# Patient Record
Sex: Female | Born: 2000 | Race: White | Hispanic: No | Marital: Single | Smoking: Current some day smoker
Health system: Southern US, Community
[De-identification: ages and names within clinical notes are randomized; demographics above are authoritative.]

---
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
## 2018-01-24 ENCOUNTER — Emergency Department (HOSPITAL_COMMUNITY)
Admission: EM | Admit: 2018-01-24 | Discharge: 2018-01-24 | Disposition: A | Discharge status: Home / Self Care | Payer: Medicaid Other | Attending: Emergency Medicine | Admitting: Emergency Medicine

## 2018-01-24 ENCOUNTER — Other Ambulatory Visit: Payer: Self-pay

## 2018-01-24 ENCOUNTER — Emergency Department (HOSPITAL_COMMUNITY): Payer: Medicaid Other

## 2018-01-24 ENCOUNTER — Encounter (HOSPITAL_COMMUNITY): Payer: Self-pay | Admitting: Emergency Medicine

## 2018-01-24 DIAGNOSIS — F17210 Nicotine dependence, cigarettes, uncomplicated: Secondary | ICD-10-CM | POA: Diagnosis not present

## 2018-01-24 DIAGNOSIS — S40211A Abrasion of right shoulder, initial encounter: Secondary | ICD-10-CM | POA: Diagnosis not present

## 2018-01-24 DIAGNOSIS — S80812A Abrasion, left lower leg, initial encounter: Secondary | ICD-10-CM | POA: Insufficient documentation

## 2018-01-24 DIAGNOSIS — S0990XA Unspecified injury of head, initial encounter: Secondary | ICD-10-CM | POA: Diagnosis present

## 2018-01-24 DIAGNOSIS — T07XXXA Unspecified multiple injuries, initial encounter: Secondary | ICD-10-CM

## 2018-01-24 DIAGNOSIS — Y999 Unspecified external cause status: Secondary | ICD-10-CM | POA: Insufficient documentation

## 2018-01-24 DIAGNOSIS — M791 Myalgia, unspecified site: Secondary | ICD-10-CM

## 2018-01-24 DIAGNOSIS — R109 Unspecified abdominal pain: Secondary | ICD-10-CM | POA: Insufficient documentation

## 2018-01-24 DIAGNOSIS — Y9389 Activity, other specified: Secondary | ICD-10-CM | POA: Diagnosis not present

## 2018-01-24 DIAGNOSIS — Y92410 Unspecified street and highway as the place of occurrence of the external cause: Secondary | ICD-10-CM | POA: Insufficient documentation

## 2018-01-24 DIAGNOSIS — R51 Headache: Secondary | ICD-10-CM | POA: Diagnosis not present

## 2018-01-24 DIAGNOSIS — M542 Cervicalgia: Secondary | ICD-10-CM | POA: Insufficient documentation

## 2018-01-24 DIAGNOSIS — M25561 Pain in right knee: Secondary | ICD-10-CM

## 2018-01-24 DIAGNOSIS — M25511 Pain in right shoulder: Secondary | ICD-10-CM

## 2018-01-24 DIAGNOSIS — S80811A Abrasion, right lower leg, initial encounter: Secondary | ICD-10-CM | POA: Diagnosis not present

## 2018-01-24 LAB — COMPREHENSIVE METABOLIC PANEL
ALBUMIN: 4.4 g/dL (ref 3.5–5.0)
ALK PHOS: 71 U/L (ref 38–126)
ALT: 11 U/L — AB (ref 14–54)
ANION GAP: 11 (ref 5–15)
AST: 24 U/L (ref 15–41)
BILIRUBIN TOTAL: 0.5 mg/dL (ref 0.3–1.2)
BUN: 8 mg/dL (ref 6–20)
CALCIUM: 9.5 mg/dL (ref 8.9–10.3)
CO2: 22 mmol/L (ref 22–32)
CREATININE: 0.78 mg/dL (ref 0.44–1.00)
Chloride: 106 mmol/L (ref 101–111)
GFR calc Af Amer: >60 mL/min (ref >60)
GFR calc non Af Amer: >60 mL/min (ref >60)
GLUCOSE: 78 mg/dL (ref 65–99)
Potassium: 4.1 mmol/L (ref 3.5–5.1)
SODIUM: 139 mmol/L (ref 135–145)
TOTAL PROTEIN: 7.5 g/dL (ref 6.5–8.1)

## 2018-01-24 LAB — CBC
HCT: 41.8 % (ref 36.0–46.0)
Hemoglobin: 14.2 g/dL (ref 12.0–15.0)
MCH: 29.2 pg (ref 26.0–34.0)
MCHC: 34 g/dL (ref 30.0–36.0)
MCV: 86 fL (ref 78.0–100.0)
Platelets: 195 10*3/uL (ref 150–400)
RBC: 4.86 MIL/uL (ref 3.87–5.11)
RDW: 12.9 % (ref 11.5–15.5)
WBC: 13.4 10*3/uL — ABNORMAL HIGH (ref 4.0–10.5)

## 2018-01-24 LAB — I-STAT BETA HCG BLOOD, ED (MC, WL, AP ONLY)

## 2018-01-24 MED ORDER — METHOCARBAMOL 500 MG PO TABS: 500.0000 mg | ORAL_TABLET | Freq: Two times a day (BID) | ORAL | 0 refills | Status: AC | PRN

## 2018-01-24 MED ORDER — ACETAMINOPHEN 325 MG PO TABS
650.0000 mg | ORAL_TABLET | Freq: Once | ORAL | Status: AC
  Administered 2018-01-24: 650 mg via ORAL
  Filled 2018-01-24: qty 2

## 2018-01-24 MED ORDER — METHOCARBAMOL 500 MG PO TABS
500.0000 mg | ORAL_TABLET | Freq: Once | ORAL | Status: AC
  Administered 2018-01-24: 500 mg via ORAL
  Filled 2018-01-24: qty 1

## 2018-01-24 MED ORDER — IBUPROFEN 800 MG PO TABS: 800.0000 mg | ORAL_TABLET | Freq: Three times a day (TID) | ORAL | 0 refills | Status: DC | PRN

## 2018-01-24 MED ORDER — IOPAMIDOL (ISOVUE-300) INJECTION 61%
80.0000 mL | Freq: Once | INTRAVENOUS | Status: AC | PRN
  Administered 2018-01-24: 100 mL via INTRAVENOUS

## 2018-01-24 NOTE — ED Notes (Signed)
Pt and mother state they understand instructions and perform teach bac.
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
## 2018-02-28 ENCOUNTER — Other Ambulatory Visit: Payer: Self-pay

## 2018-02-28 ENCOUNTER — Encounter: Payer: Self-pay | Admitting: Emergency Medicine

## 2018-02-28 ENCOUNTER — Emergency Department
Admission: EM | Admit: 2018-02-28 | Discharge: 2018-02-28 | Disposition: A | Discharge status: Home / Self Care | Payer: Medicaid Other | Attending: Emergency Medicine | Admitting: Emergency Medicine

## 2018-02-28 ENCOUNTER — Emergency Department: Payer: Medicaid Other

## 2018-02-28 DIAGNOSIS — Y999 Unspecified external cause status: Secondary | ICD-10-CM | POA: Insufficient documentation

## 2018-02-28 DIAGNOSIS — S99922A Unspecified injury of left foot, initial encounter: Secondary | ICD-10-CM | POA: Diagnosis present

## 2018-02-28 DIAGNOSIS — Y929 Unspecified place or not applicable: Secondary | ICD-10-CM | POA: Insufficient documentation

## 2018-02-28 DIAGNOSIS — X500XXA Overexertion from strenuous movement or load, initial encounter: Secondary | ICD-10-CM | POA: Diagnosis not present

## 2018-02-28 DIAGNOSIS — F17210 Nicotine dependence, cigarettes, uncomplicated: Secondary | ICD-10-CM | POA: Diagnosis not present

## 2018-02-28 DIAGNOSIS — S93602A Unspecified sprain of left foot, initial encounter: Secondary | ICD-10-CM | POA: Insufficient documentation

## 2018-02-28 DIAGNOSIS — Y939 Activity, unspecified: Secondary | ICD-10-CM | POA: Diagnosis not present

## 2018-02-28 MED ORDER — IBUPROFEN 600 MG PO TABS
600.0000 mg | ORAL_TABLET | Freq: Once | ORAL | Status: AC
  Administered 2018-02-28: 600 mg via ORAL
  Filled 2018-02-28: qty 1

## 2018-02-28 MED ORDER — IBUPROFEN 600 MG PO TABS: 600.0000 mg | ORAL_TABLET | Freq: Four times a day (QID) | ORAL | 0 refills | Status: AC | PRN

## 2018-02-28 NOTE — ED Provider Notes (Signed)
[facility] Emergency Department Provider Note  ____________________________________________  Time seen: Approximately 11:02 PM  I have reviewed the triage vital signs and the nursing notes.   HISTORY  Chief Complaint Foot Pain    HPI Claudia Frye is a 17 y.o. female that presents to the emergency department for evaluation of left foot pain after injury tonight.  Patient was climbing up a ladder at work when she stepped wrong and felt immediate pain in her left foot.  Pain is primarily over the top of her foot and over the arch.  She did not fall.  She has had pain with weightbearing since incident.  No numbness, tingling.   History reviewed. No pertinent past medical history.  There are no active problems to display for this patient.   History reviewed. No pertinent surgical history.  Prior to Admission medications   Medication Sig Start Date End Date Taking? Authorizing Provider  cyclobenzaprine (FLEXERIL) 5 MG tablet Take 1 tablet (5 mg total) by mouth 3 (three) times daily as needed for muscle spasms. Patient not taking: Reported on 01/24/2018 08/21/16   Viviano Simas, NP  HYDROcodone-acetaminophen (LORTAB) 5-325 MG tablet Take 1 tablet by mouth every 4 (four) hours as needed for moderate pain. Patient not taking: Reported on 01/24/2018 12/16/16   Ree Shay, MD  ibuprofen (ADVIL,MOTRIN) 600 MG tablet Take 1 tablet (600 mg total) by mouth every 6 (six) hours as needed. 02/28/18   Enid Derry, PA-C  methocarbamol (ROBAXIN) 500 MG tablet Take 1 tablet (500 mg total) by mouth 2 (two) times daily as needed. 01/24/18   Ward, Chase Picket, PA-C    Allergies Patient has no known allergies.  No family history on file.  Social History Social History   Tobacco Use  . Smoking status: Current Some Day Smoker    Types: Cigarettes  . Smokeless tobacco: Never Used  Substance Use Topics  . Alcohol use: No  . Drug use: No     Review of Systems   Gastrointestinal: No abdominal pain.  No nausea, no vomiting.  Musculoskeletal: Positive for foot pain. Skin: Negative for  abrasions, lacerations, ecchymosis. Neurological: Negative for headaches, numbness or tingling   ____________________________________________   PHYSICAL EXAM:  VITAL SIGNS: ED Triage Vitals  Enc Vitals Group     BP 02/28/18 2115 123/71     Pulse Rate 02/28/18 2115 92     Resp 02/28/18 2115 16     Temp 02/28/18 2115 98.3 F (36.8 C)     Temp Source 02/28/18 2115 Oral     SpO2 02/28/18 2115 100 %     Weight 02/28/18 2116 120 lb (54.4 kg)     Height 02/28/18 2116 5\' 4"  (1.626 m)     Head Circumference --      Peak Flow --      Pain Score 02/28/18 2122 10     Pain Loc --      Pain Edu? --      Excl. in GC? --      Constitutional: Alert and oriented. Well appearing and in no acute distress. Eyes: Conjunctivae are normal. PERRL. EOMI. Head: Atraumatic. ENT:      Ears:      Nose: No congestion/rhinnorhea.      Mouth/Throat: Mucous membranes are moist.  Neck: No stridor.  Cardiovascular: Normal rate, regular rhythm.  Good peripheral circulation. Respiratory: Normal respiratory effort without tachypnea or retractions. Lungs CTAB. Good air entry to the bases with no decreased or  absent breath sounds. Musculoskeletal: Full range of motion to all extremities. No gross deformities appreciated. Full ROM of toes.  Neurologic:  Normal speech and language. No gross focal neurologic deficits are appreciated.  Skin:  Skin is warm, dry and intact.  Mild erythema and swelling to dorsal foot over 3rd and 4th metatarsals.  Psychiatric: Mood and affect are normal. Speech and behavior are normal. Patient exhibits appropriate insight and judgement.   ____________________________________________   LABS (all labs ordered are listed, but only abnormal results are displayed)  Labs Reviewed - No data to  display ____________________________________________  EKG   ____________________________________________  RADIOLOGY Lexine BatonI, Diondra Pines, personally viewed and evaluated these images (plain radiographs) as part of my medical decision making, as well as reviewing the written report by the radiologist.  Dg Foot Complete Left  Result Date: 02/28/2018 CLINICAL DATA:  Felt a pop in  foot while going up ladder. EXAM: LEFT FOOT - COMPLETE 3+ VIEW COMPARISON:  None. FINDINGS: There is no evidence of fracture or dislocation. Bone island anterior aspect of talus. There is no evidence of arthropathy or other focal bone abnormality. Dorsal to the tissue swelling without subcutaneous gas or radiopaque foreign bodies. IMPRESSION: Soft tissue swelling.  No acute osseous process. Electronically Signed   By: Awilda Metroourtnay  Bloomer M.D.   On: 02/28/2018 21:44    ____________________________________________    PROCEDURES  Procedure(s) performed:    Procedures    Medications  ibuprofen (ADVIL,MOTRIN) tablet 600 mg (600 mg Oral Given 02/28/18 2326)     ____________________________________________   INITIAL IMPRESSION / ASSESSMENT AND PLAN / ED COURSE  Pertinent labs & imaging results that were available during my care of the patient were reviewed by me and considered in my medical decision making (see chart for details).  Review of the [place] CSRS was performed in accordance of the [facility] prior to dispensing any controlled drugs.     Patient's diagnosis is consistent with foot sprain. Vital signs and exam are reassuring. Xray negative for acute bony abnormality.  Foot was Ace wrapped.  Postop she was given.  Crutches were provided.  Patient will be discharged home with prescriptions for ibuprofen.  Patient is to follow up with podiatry as directed. Patient is given ED precautions to return to the ED for any worsening or new symptoms.     ____________________________________________  FINAL CLINICAL  IMPRESSION(S) / ED DIAGNOSES  Final diagnoses:  Sprain of left foot, initial encounter      NEW MEDICATIONS STARTED DURING THIS VISIT:  ED Discharge Orders        Ordered    ibuprofen (ADVIL,MOTRIN) 600 MG tablet  Every 6 hours PRN     02/28/18 2331          This chart was dictated using voice recognition software/Dragon. Despite best efforts to proofread, errors can occur which can change the meaning. Any change was purely unintentional.    Enid DerryWagner, Pascal Stiggers, PA-C 02/28/18 2343    Emily FilbertWilliams, Jonathan E, MD 03/08/18 [phone]

## 2018-02-28 NOTE — ED Triage Notes (Signed)
Pt presents to ED with left foot pain. Pt states she went to step up on a ladder and when she put weight on her left foot she heard a loud pop and followed by pain. Redness and swelling noted to the affected foot.

## 2018-02-28 NOTE — ED Notes (Signed)
Pt has pain and swelling to top of left foot. Pt was standing on a ladder and heard a pop in her foot and fell to the ground.

## 2021-03-26 ENCOUNTER — Other Ambulatory Visit: Payer: Self-pay

## 2021-03-26 ENCOUNTER — Emergency Department (HOSPITAL_COMMUNITY)
Admission: EM | Admit: 2021-03-26 | Discharge: 2021-03-26 | Disposition: A | Discharge status: Home / Self Care | Payer: Medicaid Other | Attending: Emergency Medicine | Admitting: Emergency Medicine

## 2021-03-26 ENCOUNTER — Encounter (HOSPITAL_COMMUNITY): Payer: Self-pay

## 2021-03-26 ENCOUNTER — Emergency Department (HOSPITAL_COMMUNITY): Payer: Medicaid Other

## 2021-03-26 DIAGNOSIS — E871 Hypo-osmolality and hyponatremia: Secondary | ICD-10-CM | POA: Diagnosis not present

## 2021-03-26 DIAGNOSIS — D72829 Elevated white blood cell count, unspecified: Secondary | ICD-10-CM | POA: Diagnosis not present

## 2021-03-26 DIAGNOSIS — R197 Diarrhea, unspecified: Secondary | ICD-10-CM | POA: Diagnosis not present

## 2021-03-26 DIAGNOSIS — F17210 Nicotine dependence, cigarettes, uncomplicated: Secondary | ICD-10-CM | POA: Insufficient documentation

## 2021-03-26 DIAGNOSIS — R109 Unspecified abdominal pain: Secondary | ICD-10-CM

## 2021-03-26 DIAGNOSIS — R1013 Epigastric pain: Secondary | ICD-10-CM | POA: Diagnosis present

## 2021-03-26 LAB — URINALYSIS, ROUTINE W REFLEX MICROSCOPIC
Bilirubin Urine: NEGATIVE
Glucose, UA: NEGATIVE mg/dL
Ketones, ur: NEGATIVE mg/dL
Nitrite: NEGATIVE
Protein, ur: NEGATIVE mg/dL
Specific Gravity, Urine: 1.006 (ref 1.005–1.030)
pH: 6 (ref 5.0–8.0)

## 2021-03-26 LAB — POC URINE PREG, ED: Preg Test, Ur: NEGATIVE

## 2021-03-26 LAB — COMPREHENSIVE METABOLIC PANEL
ALT: 14 U/L (ref 0–44)
AST: 17 U/L (ref 15–41)
Albumin: 4.2 g/dL (ref 3.5–5.0)
Alkaline Phosphatase: 74 U/L (ref 38–126)
Anion gap: 10 (ref 5–15)
BUN: 10 mg/dL (ref 6–20)
CO2: 22 mmol/L (ref 22–32)
Calcium: 9.5 mg/dL (ref 8.9–10.3)
Chloride: 102 mmol/L (ref 98–111)
Creatinine, Ser: 0.74 mg/dL (ref 0.44–1.00)
GFR, Estimated: >60 mL/min (ref >60)
Glucose, Bld: 99 mg/dL (ref 70–99)
Potassium: 4 mmol/L (ref 3.5–5.1)
Sodium: 134 mmol/L — ABNORMAL LOW (ref 135–145)
Total Bilirubin: 0.3 mg/dL (ref 0.3–1.2)
Total Protein: 7.5 g/dL (ref 6.5–8.1)

## 2021-03-26 LAB — CBC
HCT: 41.3 % (ref 36.0–46.0)
Hemoglobin: 14.2 g/dL (ref 12.0–15.0)
MCH: 31.2 pg (ref 26.0–34.0)
MCHC: 34.4 g/dL (ref 30.0–36.0)
MCV: 90.8 fL (ref 80.0–100.0)
Platelets: 201 10*3/uL (ref 150–400)
RBC: 4.55 MIL/uL (ref 3.87–5.11)
RDW: 12.6 % (ref 11.5–15.5)
WBC: 7.1 10*3/uL (ref 4.0–10.5)
nRBC: 0 % (ref 0.0–0.2)

## 2021-03-26 LAB — LIPASE, BLOOD: Lipase: 30 U/L (ref 11–51)

## 2021-03-26 MED ORDER — SUCRALFATE 1 G PO TABS: 1.0000 g | ORAL_TABLET | Freq: Three times a day (TID) | ORAL | 0 refills | Status: AC

## 2021-03-26 MED ORDER — DICYCLOMINE HCL 10 MG PO CAPS
20.0000 mg | ORAL_CAPSULE | Freq: Once | ORAL | Status: AC
  Administered 2021-03-26: 20 mg via ORAL
  Filled 2021-03-26: qty 2

## 2021-03-26 MED ORDER — ONDANSETRON 4 MG PO TBDP: 4.0000 mg | ORAL_TABLET | Freq: Three times a day (TID) | ORAL | 0 refills | Status: AC | PRN

## 2021-03-26 MED ORDER — ONDANSETRON HCL 4 MG/2ML IJ SOLN
4.0000 mg | Freq: Once | INTRAMUSCULAR | Status: AC
  Administered 2021-03-26: 4 mg via INTRAVENOUS
  Filled 2021-03-26: qty 2

## 2021-03-26 MED ORDER — MORPHINE SULFATE (PF) 4 MG/ML IV SOLN
4.0000 mg | Freq: Once | INTRAVENOUS | Status: AC
Start: 2021-03-26 — End: 2021-03-26
  Administered 2021-03-26: 4 mg via INTRAVENOUS
  Filled 2021-03-26: qty 1

## 2021-03-26 MED ORDER — LIDOCAINE VISCOUS HCL 2 % MT SOLN
15.0000 mL | Freq: Once | OROMUCOSAL | Status: AC
  Administered 2021-03-26: 15 mL via ORAL
  Filled 2021-03-26: qty 15

## 2021-03-26 MED ORDER — ALUM & MAG HYDROXIDE-SIMETH 200-200-20 MG/5ML PO SUSP
30.0000 mL | Freq: Once | ORAL | Status: AC
  Administered 2021-03-26: 30 mL via ORAL
  Filled 2021-03-26: qty 30

## 2021-03-26 MED ORDER — PANTOPRAZOLE SODIUM 40 MG PO TBEC: 40.0000 mg | DELAYED_RELEASE_TABLET | Freq: Every day | ORAL | 0 refills | Status: AC

## 2021-03-26 MED ORDER — SODIUM CHLORIDE 0.9 % IV BOLUS
1000.0000 mL | Freq: Once | INTRAVENOUS | Status: AC
  Administered 2021-03-26: 1000 mL via INTRAVENOUS

## 2021-03-26 MED ORDER — FAMOTIDINE IN NACL 20-0.9 MG/50ML-% IV SOLN
20.0000 mg | Freq: Once | INTRAVENOUS | Status: AC
  Administered 2021-03-26: 20 mg via INTRAVENOUS
  Filled 2021-03-26: qty 50

## 2021-03-26 NOTE — ED Triage Notes (Signed)
Pt. States they have been having abdominal pain since 2200. Pt. States they tried tums and tylenol with no relief. Pt. States they have been experiencing diarrhea as well. Pt. Denies any N/V.

## 2021-03-26 NOTE — ED Provider Notes (Signed)
[facility] EMERGENCY DEPARTMENT Provider Note   CSN: [id number] Arrival date & time: 03/26/21  1133     History Chief Complaint  Patient presents with   Abdominal Pain    Claudia Frye is a 20 y.o. female with a history of tobacco abuse who presents to the emergency department with complaints of abdominal pain that began around 2200 last night.  Patient states the pain is to the upper abdomen primarily in the epigastric region, feels like a sharp burning sensation, no specific alleviating or aggravating factors.  She tried taking Tums as well as ibuprofen without relief.  She had beef/chicken shredded tacos last night, everyone else in the family tolerated these well.  She has had 3-4 episodes of loose stool as well.  She denies fever, nausea, vomiting, melena, hematochezia, dysuria, vaginal bleeding, or vaginal discharge.  She denies foreign travel or recent antibiotic use.  HPI     History reviewed. No pertinent past medical history.  There are no problems to display for this patient.   History reviewed. No pertinent surgical history.   OB History   No obstetric history on file.     History reviewed. No pertinent family history.  Social History   Tobacco Use   Smoking status: Some Days    Pack years: 0.00    Types: Cigarettes   Smokeless tobacco: Never  Vaping Use   Vaping Use: Never used  Substance Use Topics   Alcohol use: No   Drug use: No    Home Medications Prior to Admission medications   Medication Sig Start Date End Date Taking? Authorizing Provider  cyclobenzaprine (FLEXERIL) 5 MG tablet Take 1 tablet (5 mg total) by mouth 3 (three) times daily as needed for muscle spasms. Patient not taking: Reported on 01/24/2018 08/21/16   Viviano Simas, NP  HYDROcodone-acetaminophen (LORTAB) 5-325 MG tablet Take 1 tablet by mouth every 4 (four) hours as needed for moderate pain. Patient not taking: Reported on 01/24/2018 12/16/16   Ree Shay, MD  ibuprofen  (ADVIL,MOTRIN) 600 MG tablet Take 1 tablet (600 mg total) by mouth every 6 (six) hours as needed. 02/28/18   Enid Derry, PA-C  methocarbamol (ROBAXIN) 500 MG tablet Take 1 tablet (500 mg total) by mouth 2 (two) times daily as needed. 01/24/18   Ward, Chase Picket, PA-C    Allergies    Patient has no known allergies.  Review of Systems   Review of Systems  Constitutional:  Negative for chills and fever.  Respiratory:  Negative for shortness of breath.   Cardiovascular:  Negative for chest pain.  Gastrointestinal:  Positive for abdominal pain and diarrhea. Negative for anal bleeding, blood in stool, constipation, nausea and vomiting.  Genitourinary:  Negative for dysuria, vaginal bleeding and vaginal discharge.  Neurological:  Negative for syncope.  All other systems reviewed and are negative.  Physical Exam Updated Vital Signs BP (!) 127/96 (BP Location: Right Arm)   Pulse 64   Temp 98.1 F (36.7 C) (Oral)   Resp 17   Ht 5\' 4"  (1.626 m)   Wt 59.9 kg   SpO2 100%   BMI 22.66 kg/m   Physical Exam Vitals and nursing note reviewed.  Constitutional:      General: She is not in acute distress.    Appearance: She is well-developed. She is not toxic-appearing.  HENT:     Head: Normocephalic and atraumatic.  Eyes:     General:        Right eye: No discharge.  Left eye: No discharge.     Conjunctiva/sclera: Conjunctivae normal.  Cardiovascular:     Rate and Rhythm: Normal rate and regular rhythm.  Pulmonary:     Effort: Pulmonary effort is normal. No respiratory distress.     Breath sounds: Normal breath sounds. No wheezing, rhonchi or rales.  Abdominal:     General: There is no distension.     Palpations: Abdomen is soft.     Tenderness: There is abdominal tenderness in the right upper quadrant, epigastric area and left upper quadrant.  Musculoskeletal:     Cervical back: Neck supple.  Skin:    General: Skin is warm and dry.     Findings: No rash.  Neurological:      Mental Status: She is alert.     Comments: Clear speech.   Psychiatric:        Behavior: Behavior normal.    ED Results / Procedures / Treatments   Labs (all labs ordered are listed, but only abnormal results are displayed) Labs Reviewed  COMPREHENSIVE METABOLIC PANEL - Abnormal; Notable for the following components:      Result Value   Sodium 134 (*)    All other components within normal limits  URINALYSIS, ROUTINE W REFLEX MICROSCOPIC - Abnormal; Notable for the following components:   Color, Urine STRAW (*)    APPearance HAZY (*)    Hgb urine dipstick SMALL (*)    Leukocytes,Ua SMALL (*)    Bacteria, UA RARE (*)    All other components within normal limits  LIPASE, BLOOD  CBC  POC URINE PREG, ED    EKG None  Radiology US Abdomen Limited RUQ (LIVER/GB)  Result Date: 03/26/2021 CLINICAL DATA:  Right upper quadrant pain for 1 day. EXAM: ULTRASOUND ABDOMEN LIMITED RIGHT UPPER QUADRANT COMPARISON:  CT chest, abdomen and pelvis 01/24/2018. FINDINGS: Gallbladder: No gallstones or wall thickening visualized. No sonographic Murphy sign noted by sonographer. Common bile duct: Diameter: 0.5 cm. Liver: No focal lesion identified. Within normal limits in parenchymal echogenicity. Portal vein is patent on color Doppler imaging with normal direction of blood flow towards the liver. Other: None. IMPRESSION: Negative for gallstones.  Normal exam. Electronically Signed   By: Drusilla Kanner M.D.   On: 03/26/2021 13:13    Procedures Procedures   Medications Ordered in ED Medications  famotidine (PEPCID) IVPB 20 mg premix (has no administration in time range)  sodium chloride 0.9 % bolus 1,000 mL (1,000 mLs Intravenous New Bag/Given 03/26/21 1225)  ondansetron (ZOFRAN) injection 4 mg (4 mg Intravenous Given 03/26/21 1225)  morphine 4 MG/ML injection 4 mg (4 mg Intravenous Given 03/26/21 1225)    ED Course  I have reviewed the triage vital signs and the nursing notes.  Pertinent labs &  imaging results that were available during my care of the patient were reviewed by me and considered in my medical decision making (see chart for details).    MDM Rules/Calculators/A&P                         Patient presents to the ED with complaints of abdominal pain. Patient nontoxic appearing, in no apparent distress, vitals without significant abnormality. On exam patient tender to the upper abdomen, no peritoneal signs. Will evaluate with labs and RUQ [place]. Marland Kitchen Analgesics, anti-emetics, and fluids administered.   Additional history obtained:  Additional history obtained from chart review & nursing note review.   Lab Tests:  I Ordered, reviewed, and interpreted  labs, which included:  CBC: unremarkable.  CMP: mild hyponatremia Lipase: WNL UA:  small hgb, small leukocytes, rare bacteria- no urinary sxs.  Preg test: Negative  Imaging Studies ordered:  I ordered imaging studies which included RUQ [place], I independently reviewed, formal radiology impression shows:  Negative for gallstones.  Normal exam  ED Course:  Following initial morphine, Zofran, and Pepcid patient did have some relief but then pain seemed to return.  Will trial GI cocktail and Bentyl.  15:50: RE-EVAL: Patient feeling much better, she feels as though the GI cocktail took her pain away.  On repeat abdominal exam patient remains without peritoneal signs, low suspicion for cholecystitis, pancreatitis, diverticulitis, appendicitis, bowel obstruction/perforation,  or other acute surgical process. Patient tolerating PO in the emergency department. Will discharge home with supportive measures. I discussed results, treatment plan, need for PCP follow-up, and return precautions with the patient. Provided opportunity for questions, patient confirmed understanding and is in agreement with plan.   Portions of this note were generated with Scientist, clinical (histocompatibility and immunogenetics). Dictation errors may occur despite best attempts at proofreading.     Final Clinical Impression(s) / ED Diagnoses Final diagnoses:  Abdominal pain    Rx / DC Orders ED Discharge Orders          Ordered    sucralfate (CARAFATE) 1 g tablet  3 times daily with meals & bedtime        03/26/21 1559    pantoprazole (PROTONIX) 40 MG tablet  Daily        03/26/21 1559    ondansetron (ZOFRAN ODT) 4 MG disintegrating tablet  Every 8 hours PRN        03/26/21 1559             Claudia Frye, Claudia Koch, PA-C 03/26/21 1609    Derwood Kaplan, MD 04/01/21 1434

## 2021-03-26 NOTE — Discharge Instructions (Addendum)
We are sending you home with the following medications to help with your symptoms:  - Protonix- please take 1 tablet in the morning prior to any meals to help with stomach acidity/pain.  - Carafate- please take prior to each meal and prior to bedtime to help with stomach acidity/pain.  - Zofran- please take every 8 hours as needed for nausea/vomiting.   We have prescribed you new medication(s) today. Discuss the medications prescribed today with your pharmacist as they can have adverse effects and interactions with your other medicines including over the counter and prescribed medications. Seek medical evaluation if you start to experience new or abnormal symptoms after taking one of these medicines, seek care immediately if you start to experience difficulty breathing, feeling of your throat closing, facial swelling, or rash as these could be indications of a more serious allergic reaction  Please follow attached diet guidelines.   Follow up with your primary care provider within 3 days for re-evaluation.  Return to the ER for new or worsening symptoms including but not limited to worsened pain, new pain, inability to keep fluids down, blood in vomit/stool, passing out, or any other concerns.

## 2022-12-02 ENCOUNTER — Other Ambulatory Visit: Payer: Self-pay

## 2022-12-02 ENCOUNTER — Emergency Department (HOSPITAL_BASED_OUTPATIENT_CLINIC_OR_DEPARTMENT_OTHER)
Admission: EM | Admit: 2022-12-02 | Discharge: 2022-12-02 | Disposition: A | Discharge status: Home / Self Care | Payer: Medicaid Other | Attending: Emergency Medicine | Admitting: Emergency Medicine

## 2022-12-02 ENCOUNTER — Encounter (HOSPITAL_BASED_OUTPATIENT_CLINIC_OR_DEPARTMENT_OTHER): Payer: Self-pay | Admitting: Emergency Medicine

## 2022-12-02 ENCOUNTER — Other Ambulatory Visit (HOSPITAL_BASED_OUTPATIENT_CLINIC_OR_DEPARTMENT_OTHER): Payer: Self-pay

## 2022-12-02 DIAGNOSIS — J020 Streptococcal pharyngitis: Secondary | ICD-10-CM | POA: Diagnosis not present

## 2022-12-02 DIAGNOSIS — Z1152 Encounter for screening for COVID-19: Secondary | ICD-10-CM | POA: Insufficient documentation

## 2022-12-02 DIAGNOSIS — J029 Acute pharyngitis, unspecified: Secondary | ICD-10-CM | POA: Diagnosis present

## 2022-12-02 LAB — RESP PANEL BY RT-PCR (RSV, FLU A&B, COVID)  RVPGX2
Influenza A by PCR: NEGATIVE
Influenza B by PCR: NEGATIVE
Resp Syncytial Virus by PCR: NEGATIVE
SARS Coronavirus 2 by RT PCR: NEGATIVE

## 2022-12-02 LAB — CBC
HCT: 36.3 % (ref 36.0–46.0)
Hemoglobin: 12 g/dL (ref 12.0–15.0)
MCH: 27 pg (ref 26.0–34.0)
MCHC: 33.1 g/dL (ref 30.0–36.0)
MCV: 81.6 fL (ref 80.0–100.0)
Platelets: 257 10*3/uL (ref 150–400)
RBC: 4.45 MIL/uL (ref 3.87–5.11)
RDW: 17.3 % — ABNORMAL HIGH (ref 11.5–15.5)
WBC: 13 10*3/uL — ABNORMAL HIGH (ref 4.0–10.5)
nRBC: 0 % (ref 0.0–0.2)

## 2022-12-02 LAB — BASIC METABOLIC PANEL
Anion gap: 10 (ref 5–15)
BUN: 8 mg/dL (ref 6–20)
CO2: 25 mmol/L (ref 22–32)
Calcium: 9.8 mg/dL (ref 8.9–10.3)
Chloride: 104 mmol/L (ref 98–111)
Creatinine, Ser: 0.68 mg/dL (ref 0.44–1.00)
GFR, Estimated: >60 mL/min (ref >60)
Glucose, Bld: 92 mg/dL (ref 70–99)
Potassium: 4 mmol/L (ref 3.5–5.1)
Sodium: 139 mmol/L (ref 135–145)

## 2022-12-02 LAB — GROUP A STREP BY PCR: Group A Strep by PCR: DETECTED — AB

## 2022-12-02 MED ORDER — SODIUM CHLORIDE 0.9 % IV BOLUS
1000.0000 mL | Freq: Once | INTRAVENOUS | Status: AC
  Administered 2022-12-02: 1000 mL via INTRAVENOUS

## 2022-12-02 MED ORDER — FENTANYL CITRATE PF 50 MCG/ML IJ SOSY
50.0000 ug | PREFILLED_SYRINGE | Freq: Once | INTRAMUSCULAR | Status: AC
  Administered 2022-12-02: 50 ug via INTRAVENOUS
  Filled 2022-12-02: qty 1

## 2022-12-02 MED ORDER — KETOROLAC TROMETHAMINE 30 MG/ML IJ SOLN
15.0000 mg | Freq: Once | INTRAMUSCULAR | Status: AC
  Administered 2022-12-02: 15 mg via INTRAVENOUS
  Filled 2022-12-02: qty 1

## 2022-12-02 MED ORDER — PENICILLIN G BENZATHINE 1200000 UNIT/2ML IM SUSY
1.2000 10*6.[IU] | PREFILLED_SYRINGE | Freq: Once | INTRAMUSCULAR | Status: AC
  Administered 2022-12-02: 1.2 10*6.[IU] via INTRAMUSCULAR
  Filled 2022-12-02: qty 2

## 2022-12-02 MED ORDER — OXYCODONE-ACETAMINOPHEN 5-325 MG PO TABS
1.0000 | ORAL_TABLET | Freq: Three times a day (TID) | ORAL | 0 refills | Status: AC | PRN
  Filled 2022-12-02: qty 6, 1d supply, fill #0

## 2022-12-02 NOTE — ED Triage Notes (Signed)
Pt via pov  from home with sore throat x 3 days, worsening yesterday and today. She reports that it is difficult and painful to swallow. Pt alert & oriented, nad noted.

## 2022-12-02 NOTE — ED Provider Notes (Signed)
[place] Provider Note   CSN: [id number] Arrival date & time: 12/02/22  1020     History {Add pertinent medical, surgical, social history, OB history to HPI:1} Chief Complaint  Patient presents with   Sore Throat    Claudia Frye is a 22 y.o. female.   Sore Throat  Patient presents with sore throat.  Has around 3 days.  Painful swallowing.  Worse on the left side but states there is somewhat pain in both.  States she has had fevers a day or 2 ago.  No nausea or vomiting.  No sick contacts.  Denies pregnancy.  No coughing.    History reviewed. No pertinent past medical history.  Home Medications Prior to Admission medications   Medication Sig Start Date End Date Taking? Authorizing Provider  ibuprofen (ADVIL,MOTRIN) 600 MG tablet Take 1 tablet (600 mg total) by mouth every 6 (six) hours as needed. 02/28/18   Laban Emperor, PA-C  methocarbamol (ROBAXIN) 500 MG tablet Take 1 tablet (500 mg total) by mouth 2 (two) times daily as needed. 01/24/18   Ward, Ozella Almond, PA-C  ondansetron (ZOFRAN ODT) 4 MG disintegrating tablet Take 1 tablet (4 mg total) by mouth every 8 (eight) hours as needed for nausea or vomiting. 03/26/21   Petrucelli, Samantha R, PA-C  pantoprazole (PROTONIX) 40 MG tablet Take 1 tablet (40 mg total) by mouth daily. 03/26/21   Petrucelli, Samantha R, PA-C  sucralfate (CARAFATE) 1 g tablet Take 1 tablet (1 g total) by mouth 4 (four) times daily -  with meals and at bedtime. 03/26/21   Petrucelli, Glynda Jaeger, PA-C      Allergies    Patient has no known allergies.    Review of Systems   Review of Systems  Physical Exam Updated Vital Signs BP 117/80 (BP Location: Right Arm)   Pulse 76   Temp 98.2 F (36.8 C)   Resp 12   Ht '5\' 2"'$  (1.575 m)   Wt 59 kg   LMP 12/01/2022 (Exact Date)   SpO2 100%   BMI 23.78 kg/m  Physical Exam Vitals and nursing note reviewed.  HENT:     Mouth/Throat:     Comments: Posterior  pharyngeal erythema bilaterally.  No exudates seen.  Uvula midline along with no asymmetric swelling.  No stridor.  Mild anterior cervical lymphadenopathy. Cardiovascular:     Rate and Rhythm: Normal rate and regular rhythm.  Pulmonary:     Breath sounds: No wheezing or rhonchi.  Abdominal:     Tenderness: There is no abdominal tenderness.  Neurological:     Mental Status: She is alert.     ED Results / Procedures / Treatments   Labs (all labs ordered are listed, but only abnormal results are displayed) Labs Reviewed  GROUP A STREP BY PCR  RESP PANEL BY RT-PCR (RSV, FLU A&B, COVID)  [id number]  CBC  BASIC METABOLIC PANEL    EKG None  Radiology No results found.  Procedures Procedures  {Document cardiac monitor, telemetry assessment procedure when appropriate:1}  Medications Ordered in ED Medications  sodium chloride 0.9 % bolus 1,000 mL (has no administration in time range)  ketorolac (TORADOL) 30 MG/ML injection 15 mg (has no administration in time range)  fentaNYL (SUBLIMAZE) injection 50 mcg (has no administration in time range)    ED Course/ Medical Decision Making/ A&P   {   Click here for ABCD2, HEART and other calculatorsREFRESH Note before signing :1}  Medical Decision Making Amount and/or Complexity of Data Reviewed Labs: ordered.  Risk Prescription drug management.   Patient with sore throat.  Has had for few days.  Decreased oral intake due to pain.  Posterior pharyngeal erythema without exudate.  Differential diagnose includes different cause of pharyngitis.  Will check strep flu and COVID testing.  With decreased oral intake will give IV fluids and pain medicines.  {Document critical care time when appropriate:1} {Document review of labs and clinical decision tools ie heart score, Chads2Vasc2 etc:1}  {Document your independent review of radiology images, and any outside records:1} {Document your discussion with family members,  caretakers, and with consultants:1} {Document social determinants of health affecting pt's care:1} {Document your decision making why or why not admission, treatments were needed:1} Final Clinical Impression(s) / ED Diagnoses Final diagnoses:  None    Rx / DC Orders ED Discharge Orders     None

## 2023-02-01 IMAGING — US US ABDOMEN LIMITED
1 series · 14 of 25 positions shown · non-contrast
Comparison: CT chest, abdomen and pelvis 01/24/2018.

CLINICAL DATA: Right upper quadrant pain for 1 day.

EXAM:
ULTRASOUND ABDOMEN LIMITED RIGHT UPPER QUADRANT

[Series 1: us abdomen limited ruq (liver/gb) · 14 of 73 slices shown]
[im 1/73]
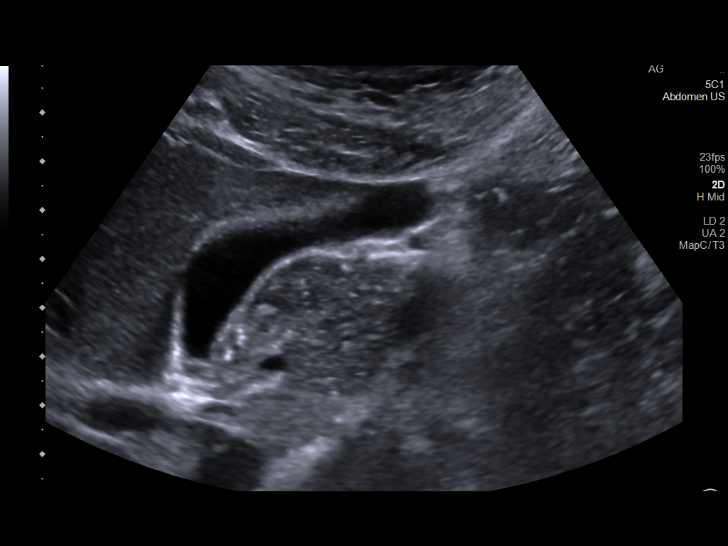
[im 7/73]
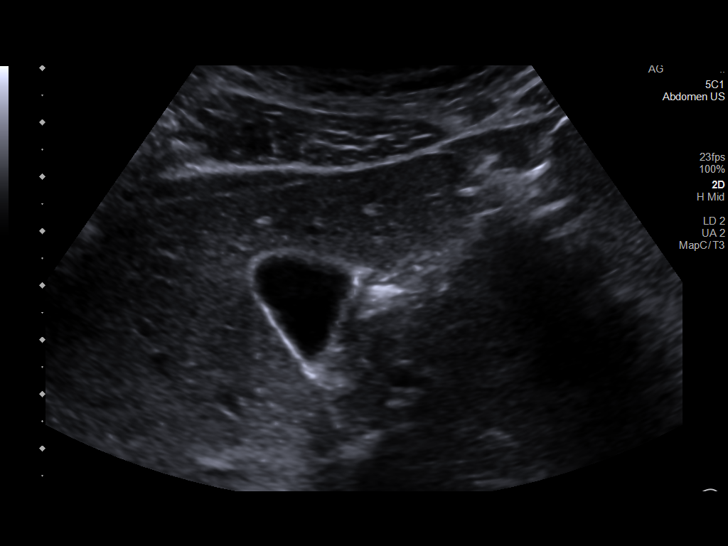
[im 13/73]
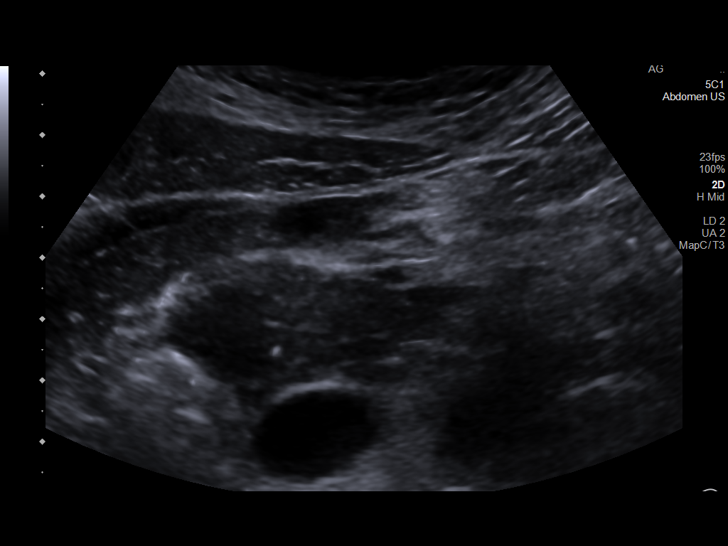
[im 19/73]
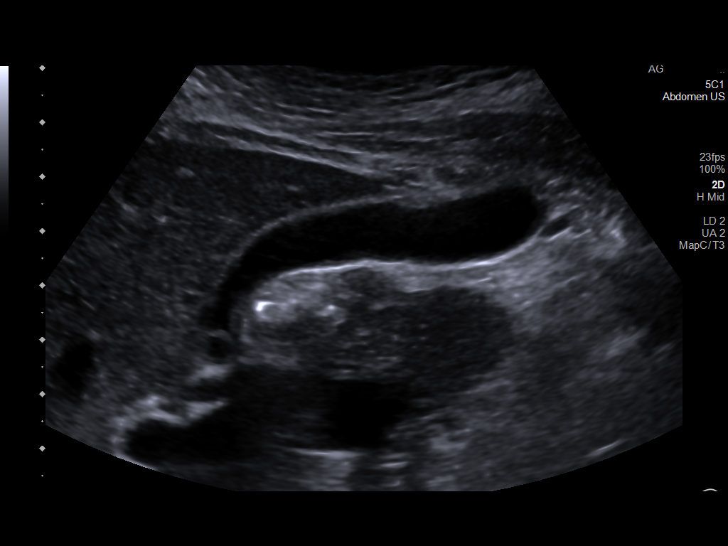
[im 25/73]
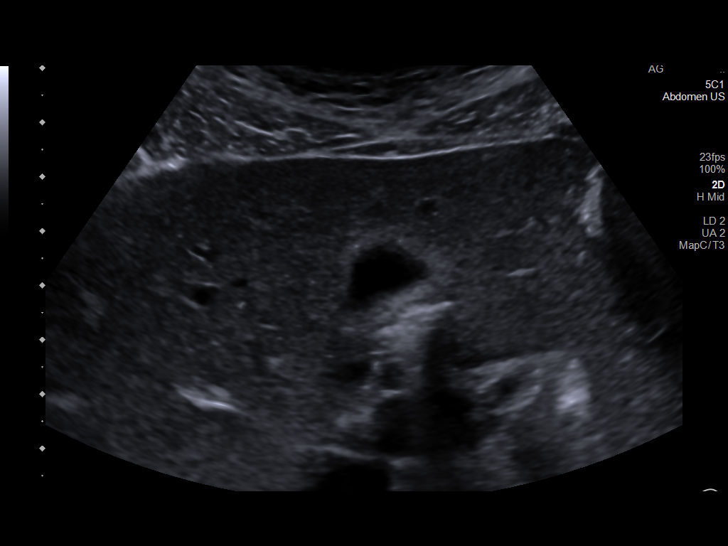
[im 28/73]
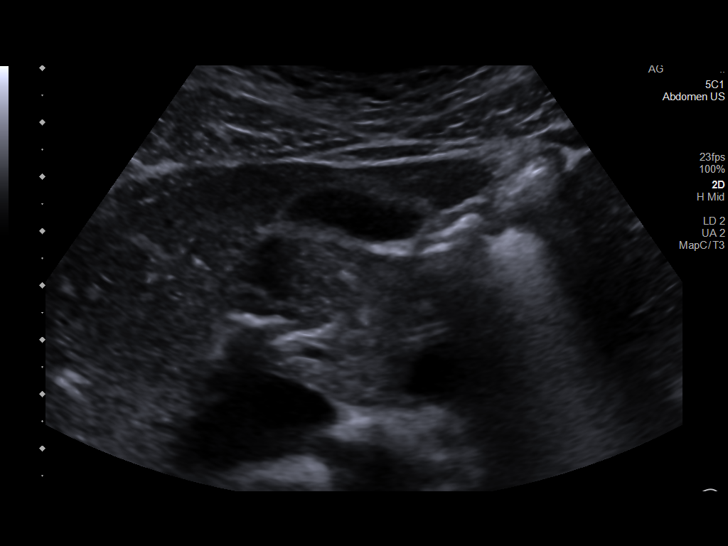
[im 34/73]
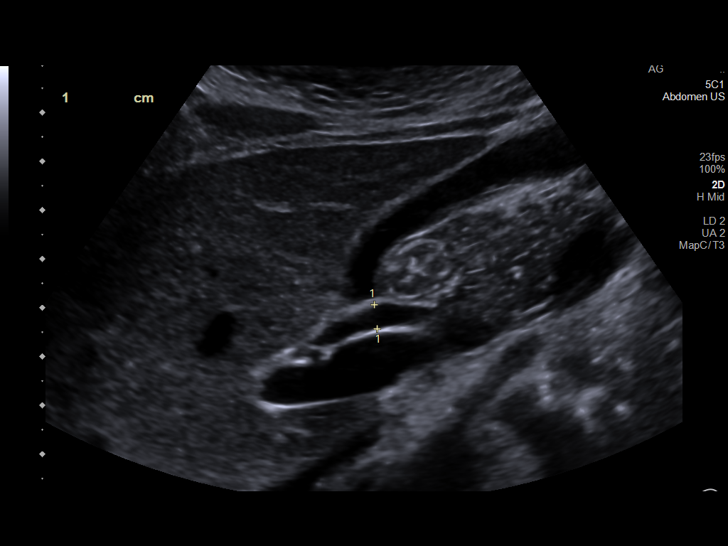
[im 40/73]
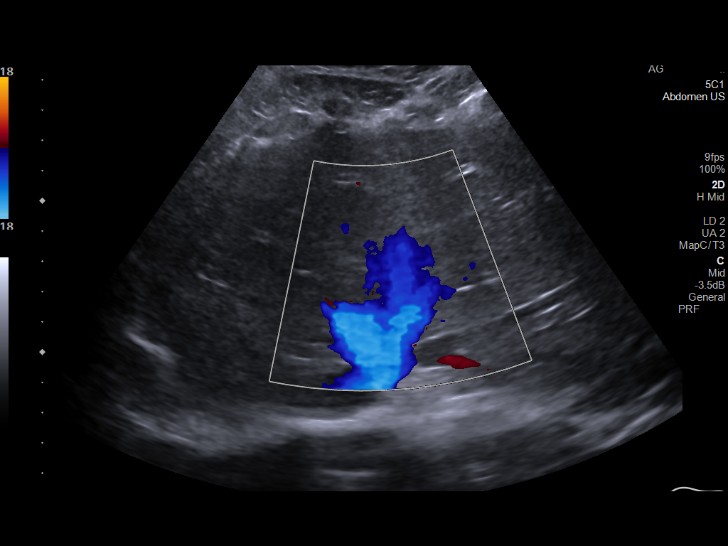
[im 46/73]
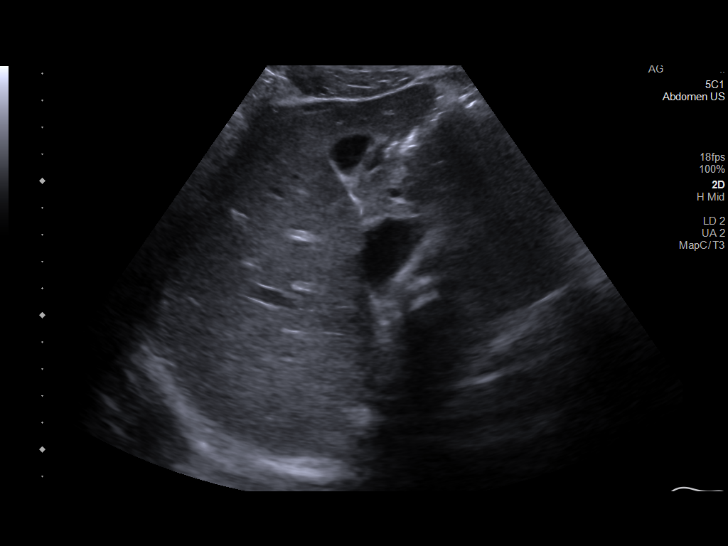
[im 49/73]
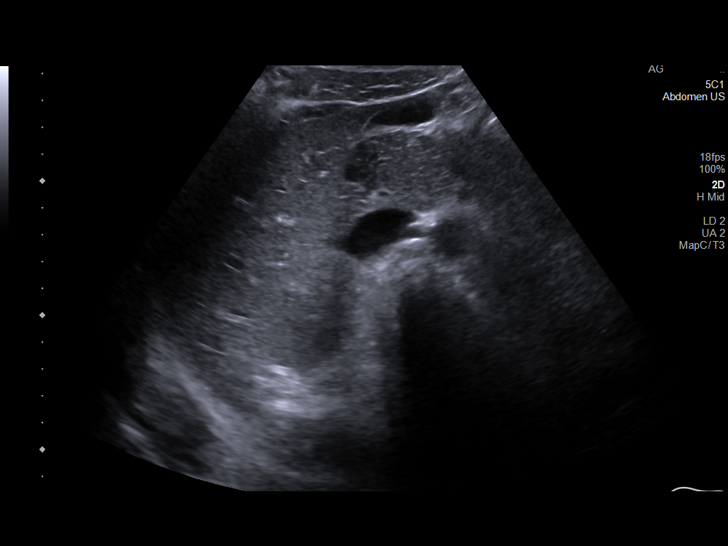
[im 55/73]
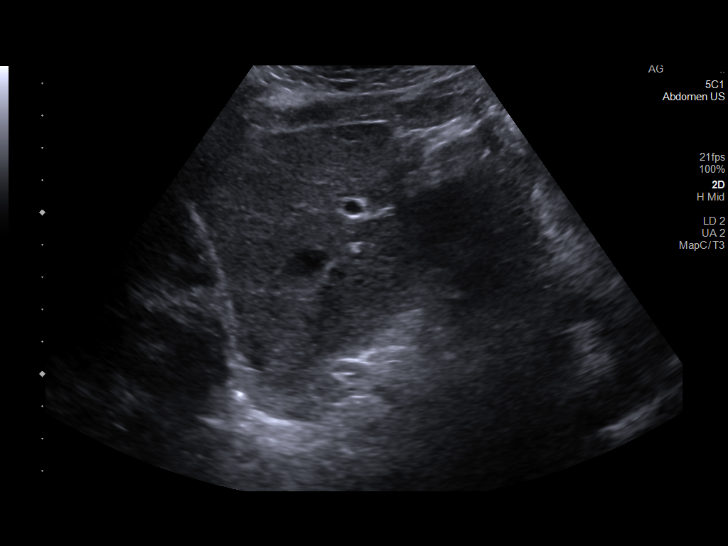
[im 61/73]
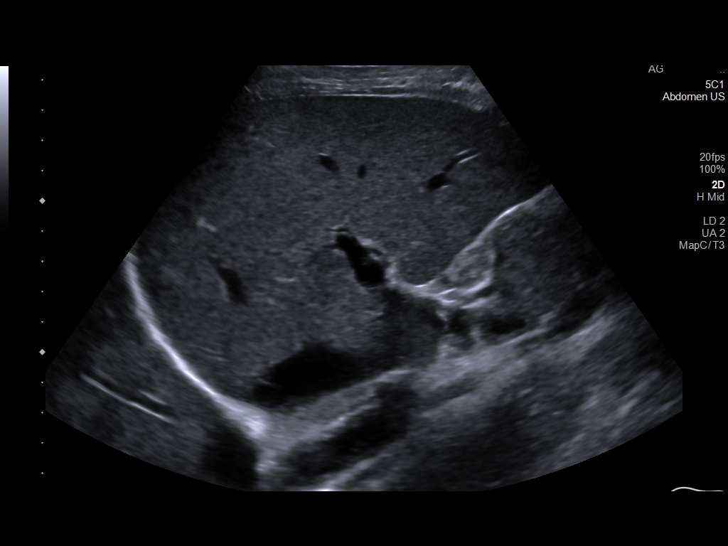
[im 67/73]
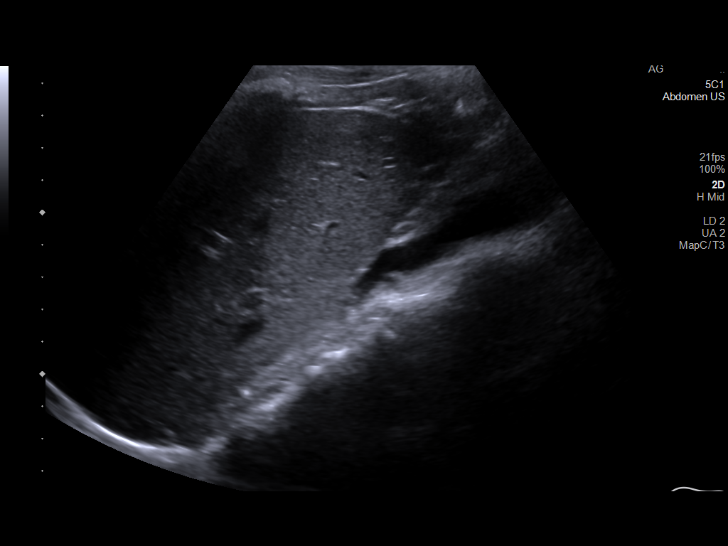
[im 73/73]
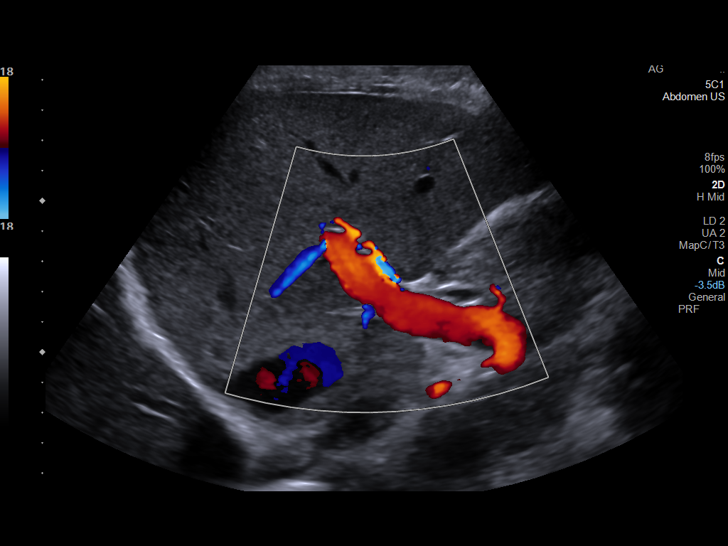

[14 of 25 positions shown; findings below may reference images not displayed]

FINDINGS: Gallbladder:

No gallstones or wall thickening visualized. No sonographic Murphy
sign noted by sonographer.

Common bile duct:

Diameter: 0.5 cm.

Liver:

No focal lesion identified. Within normal limits in parenchymal
echogenicity. Portal vein is patent on color Doppler imaging with
normal direction of blood flow towards the liver.

Other: None.
IMPRESSION: Negative for gallstones.  Normal exam.
